# Patient Record
Sex: Female | Born: 1995 | Race: White | Hispanic: No | Marital: Single | Smoking: Never smoker
Health system: Southern US, Community
[De-identification: ages and names within clinical notes are randomized; demographics above are authoritative.]

## PROBLEM LIST (undated history)

## (undated) DIAGNOSIS — Z789 Other specified health status: Secondary | ICD-10-CM

## (undated) DIAGNOSIS — J45909 Unspecified asthma, uncomplicated: Secondary | ICD-10-CM

## (undated) HISTORY — PX: NO PAST SURGERIES: SHX2092

## (undated) HISTORY — DX: Unspecified asthma, uncomplicated: J45.909

---

## 2017-09-11 ENCOUNTER — Inpatient Hospital Stay (HOSPITAL_COMMUNITY): Payer: Managed Care, Other (non HMO)

## 2017-09-11 ENCOUNTER — Encounter (HOSPITAL_COMMUNITY): Payer: Self-pay | Admitting: *Deleted

## 2017-09-11 ENCOUNTER — Ambulatory Visit (HOSPITAL_COMMUNITY)
Admission: EM | Admit: 2017-09-11 | Discharge: 2017-09-11 | Payer: Managed Care, Other (non HMO) | Source: Home / Self Care

## 2017-09-11 ENCOUNTER — Inpatient Hospital Stay (HOSPITAL_COMMUNITY)
Admission: AD | Admit: 2017-09-11 | Discharge: 2017-09-11 | Disposition: A | Discharge status: Home / Self Care | Payer: Managed Care, Other (non HMO) | Source: Ambulatory Visit | Attending: Obstetrics and Gynecology | Admitting: Obstetrics and Gynecology

## 2017-09-11 DIAGNOSIS — Z975 Presence of (intrauterine) contraceptive device: Secondary | ICD-10-CM | POA: Diagnosis not present

## 2017-09-11 DIAGNOSIS — T839XXA Unspecified complication of genitourinary prosthetic device, implant and graft, initial encounter: Secondary | ICD-10-CM

## 2017-09-11 DIAGNOSIS — R102 Pelvic and perineal pain: Secondary | ICD-10-CM | POA: Diagnosis not present

## 2017-09-11 DIAGNOSIS — Z3202 Encounter for pregnancy test, result negative: Secondary | ICD-10-CM | POA: Diagnosis not present

## 2017-09-11 HISTORY — DX: Other specified health status: Z78.9

## 2017-09-11 LAB — URINALYSIS, ROUTINE W REFLEX MICROSCOPIC
Bilirubin Urine: NEGATIVE
GLUCOSE, UA: NEGATIVE mg/dL
KETONES UR: 20 mg/dL — AB
Leukocytes, UA: NEGATIVE
Nitrite: NEGATIVE
PH: 6 (ref 5.0–8.0)
Protein, ur: NEGATIVE mg/dL
Specific Gravity, Urine: 1.008 (ref 1.005–1.030)

## 2017-09-11 LAB — WET PREP, GENITAL
CLUE CELLS WET PREP: NONE SEEN
SPERM: NONE SEEN
TRICH WET PREP: NONE SEEN
YEAST WET PREP: NONE SEEN

## 2017-09-11 LAB — POCT PREGNANCY, URINE: Preg Test, Ur: NEGATIVE

## 2017-09-11 MED ORDER — KETOROLAC TROMETHAMINE 60 MG/2ML IM SOLN
60.0000 mg | Freq: Once | INTRAMUSCULAR | Status: AC
  Administered 2017-09-11: 60 mg via INTRAMUSCULAR
  Filled 2017-09-11: qty 2

## 2017-09-11 MED ORDER — DOXYCYCLINE HYCLATE 100 MG PO TABS: 100.0000 mg | ORAL_TABLET | Freq: Two times a day (BID) | ORAL | 0 refills | Status: AC

## 2017-09-11 NOTE — MAU Note (Signed)
IUD placed on 9/24 States has been having abdominal pain off and on since then; very severe today Rating pain 10/10  +nausea; denies vomiting

## 2017-09-11 NOTE — Discharge Instructions (Signed)
Call your OB-GYN provider and schedule a follow up appointment for alter this week or early next week.  If you develop high fever, or persistent vomiting, return here or to another urgent care or emergency room.

## 2017-09-11 NOTE — MAU Provider Note (Signed)
Chief Complaint:  Abdominal Pain   First Provider Initiated Contact with Patient 09/11/17 1909       HPI: Jaime Williams is a 21 y.o. [id number] who presents to maternity admissions reporting severe pain in pelvis.  Also has some nausea.  Had IUD place on 9/24 and had some cramping which mostly stopped.  Had been doing fine since until today when severe pain started. . She reports vaginal bleeding, vaginal itching/burning, urinary symptoms, h/a, dizziness, n/v, or fever/chills.    Abdominal Pain  This is a new problem. The current episode started today. The onset quality is gradual. The problem occurs intermittently. The problem has been unchanged. The pain is located in the suprapubic region, LLQ and RLQ. The quality of the pain is sharp and cramping. The abdominal pain does not radiate. Associated symptoms include nausea. Pertinent negatives include no constipation, diarrhea, fever, headaches, myalgias or vomiting. Nothing aggravates the pain. The pain is relieved by nothing. She has tried acetaminophen for the symptoms. The treatment provided no relief.    RN note: IUD placed on 9/24 States has been having abdominal pain off and on since then; very severe today Rating pain 10/10 +nausea; denies vomiting  Past Medical History: Past Medical History:  Diagnosis Date  . Medical history non-contributory     Past obstetric history: OB History  Gravida Para Term Preterm AB Living  0 0 0 0 0 0  SAB TAB Ectopic Multiple Live Births  0 0 0 0 0        Past Surgical History: Past Surgical History:  Procedure Laterality Date  . NO PAST SURGERIES      Family History: No family history on file.  Social History: Social History  Substance Use Topics  . Smoking status: Never Smoker  . Smokeless tobacco: Never Used  . Alcohol use No    Allergies: No Known Allergies  Meds:  Prescriptions Prior to Admission  Medication Sig Dispense Refill Last Dose  . acetaminophen (TYLENOL) 500 MG  tablet Take 1,000 mg by mouth every 6 (six) hours as needed for moderate pain.   09/11/2017 at Unknown time  . escitalopram (LEXAPRO) 20 MG tablet Take 20 mg by mouth at bedtime.   09/10/2017 at Unknown time  . IRON PO Take 1 tablet by mouth 3 (three) times daily.   Past Week at Unknown time  . levonorgestrel (MIRENA) 20 MCG/24HR IUD 1 each by Intrauterine route once.   09/11/2017 at Unknown time  . QUEtiapine (SEROQUEL XR) 300 MG 24 hr tablet Take 300 mg by mouth at bedtime.   09/10/2017 at Unknown time    I have reviewed patient's Past Medical Hx, Surgical Hx, Family Hx, Social Hx, medications and allergies.  ROS:  Review of Systems  Constitutional: Negative for fever.  Gastrointestinal: Positive for abdominal pain and nausea. Negative for constipation, diarrhea and vomiting.  Musculoskeletal: Negative for myalgias.  Neurological: Negative for headaches.   Other systems negative     Physical Exam  Patient Vitals for the past 24 hrs:  BP Temp Temp src Pulse Resp SpO2 Weight  09/11/17 1842 134/72 98.1 F (36.7 C) Oral 86 19 96 % 153 lb 0.6 oz (69.4 kg)   Constitutional: Well-developed, well-nourished female in no acute distress.  Cardiovascular: normal rate and rhythm, no ectopy audible, S1 & S2 heard, no murmur Respiratory: normal effort, no distress. Lungs CTAB with no wheezes or crackles GI: Abd soft, non-tender.  Nondistended.  No rebound, No guarding.  Bowel Sounds audible  MS: Extremities nontender, no edema, normal ROM Neurologic: Alert and oriented x 4.   Grossly nonfocal. GU: Neg CVAT. Skin:  Warm and Dry Psych:  Affect appropriate.  PELVIC EXAM: Cervix pink, visually closed, without lesion, scant white creamy discharge, vaginal walls and external genitalia normal Bimanual exam: Cervix firm, anterior, neg CMT, uterus tender with palpation, nonenlarged, adnexa without tenderness, enlargement, or mass    Labs: Results for orders placed or performed during the hospital  encounter of 09/11/17 (from the past 24 hour(s))  Urinalysis, Routine w reflex microscopic     Status: Abnormal   Collection Time: 09/11/17  6:44 PM  Result Value Ref Range   Color, Urine STRAW (A) YELLOW   APPearance CLEAR CLEAR   Specific Gravity, Urine 1.008 1.005 - 1.030   pH 6.0 5.0 - 8.0   Glucose, UA NEGATIVE NEGATIVE mg/dL   Hgb urine dipstick SMALL (A) NEGATIVE   Bilirubin Urine NEGATIVE NEGATIVE   Ketones, ur 20 (A) NEGATIVE mg/dL   Protein, ur NEGATIVE NEGATIVE mg/dL   Nitrite NEGATIVE NEGATIVE   Leukocytes, UA NEGATIVE NEGATIVE   RBC / HPF 0-5 0 - 5 RBC/hpf   WBC, UA 0-5 0 - 5 WBC/hpf   Bacteria, UA MANY (A) NONE SEEN   Squamous Epithelial / LPF 0-5 (A) NONE SEEN   Mucus PRESENT   Pregnancy, urine POC     Status: None   Collection Time: 09/11/17  7:16 PM  Result Value Ref Range   Preg Test, Ur NEGATIVE NEGATIVE      Imaging:  No results found.  MAU Course/MDM: I have ordered labs as follows: UA Imaging ordered: Ultrasound Results reviewed. UA shows some hematuria, but no leukocytes.but micro shows bacteria, ?UTI  Consult Dr Vincente Poli with presentation.  She recommends removal if IUD misplaced. .   Treatments in MAU included Toradol which did not give much relief.     Assessment: 1. Pelvic pain   2. IUD complication [facility])     Plan: Care turned over to oncoming provider Pt is in [place]  Wynelle Bourgeois CNM, MSN Certified Nurse-Midwife 09/11/2017 9:03 PM  10:03 PM -- U/S reviewed: IUD in endometrial cavity in appropriate orientation.  Discussed results with patient, given symptoms, wet prep and GC/Chlamydia swabs collected, will send urine for culture, and will empirically treat with doxycycline 100 mg BID po to cover for both endometritis and possible UTI.  Raynelle Fanning P. Kentaro Alewine, MD OB Fellow

## 2017-09-12 LAB — GC/CHLAMYDIA PROBE AMP (~~LOC~~) NOT AT ARMC
CHLAMYDIA, DNA PROBE: NEGATIVE
NEISSERIA GONORRHEA: NEGATIVE

## 2017-09-13 LAB — URINE CULTURE: Culture: <10000 — AB

## 2018-08-30 ENCOUNTER — Encounter: Payer: Self-pay | Admitting: *Deleted

## 2018-09-13 ENCOUNTER — Encounter: Payer: Self-pay | Admitting: Psychiatry

## 2018-09-13 ENCOUNTER — Ambulatory Visit (INDEPENDENT_AMBULATORY_CARE_PROVIDER_SITE_OTHER): Payer: 59 | Admitting: Psychiatry

## 2018-09-13 VITALS — BP 107/71 | HR 85 | Ht 68.0 in

## 2018-09-13 DIAGNOSIS — F3181 Bipolar II disorder: Secondary | ICD-10-CM

## 2018-09-13 DIAGNOSIS — F419 Anxiety disorder, unspecified: Secondary | ICD-10-CM

## 2018-09-13 MED ORDER — BREXPIPRAZOLE 1 MG PO TABS: 1.0000 mg | ORAL_TABLET | Freq: Every day | ORAL | 1 refills | Status: AC

## 2018-09-13 MED ORDER — LITHIUM CARBONATE 150 MG PO CAPS: ORAL_CAPSULE | ORAL | 1 refills | Status: AC

## 2018-09-13 MED ORDER — ESCITALOPRAM OXALATE 20 MG PO TABS: 20.0000 mg | ORAL_TABLET | Freq: Every day | ORAL | 1 refills | Status: AC

## 2018-09-13 NOTE — Progress Notes (Signed)
Jaime Williams [id number] 08-05-1996 22 y.o.  Subjective:   Patient ID:  Jaime Williams is a 22 y.o. (DOB 01-May-1996) female.  Chief Complaint:  Chief Complaint  Patient presents with  . Follow-up    h/o depression, anxiety, mood lability, and h/o chronic SI  . Depression  . Anxiety       Jaime Williams presents to the office today for follow-up of depression and is accompanied by significant other. She reports that she has not noticed a significant difference with Lithium. Reports that she initially felt "happier" after starting Lithium and denies any recent hypomanic s/s.   Reports that she had sexual side effects with Lithium 300 mg qhs. She then reduced her dose to 150 mg po QHS the last few days.  She reports that her mood has been "good" overall. "Since starting Rexulti I haven't had anything that I would consider to be an episode like I used to have, except for after I drank." Reports that she has had periods of sadness "but nothing persistent." slef harm thoughts x 5, self harm behavior x 3, SI x 3 in the last month. She reports that self harm thoughts "were a different kind" since starting Lithium. Reports that she has been anxious about someone breaking into their apartment with boyfriend is at work. She reports sleeping 10 hours the last few days "because I don't have anything else to do." Appetite has been stable. Energy and motivation "seems normal." Denies impaired concentration. Denies SI.   Reports that she is questioning dx of BPD since she has fears of abandonment and chronic thoughts of self harm and suicide.   Moving to [place] to live with significant other. Plans to finish her coursework there and continue tx there.   Medications: I have reviewed the patient's current medications. Denies side effects  Allergies: No Known Allergies  Past Medical History:  Diagnosis Date  . Asthma    Reports h/o exercise induced asthma  . Medical history non-contributory      Past Surgical History:  Procedure Laterality Date  . NO PAST SURGERIES      Family History  Problem Relation Age of Onset  . Depression Mother   . Depression Maternal Aunt   . Depression Maternal Grandmother     Social History   Socioeconomic History  . Marital status: Single    Spouse name: Not on file  . Number of children: Not on file  . Years of education: Not on file  . Highest education level: Not on file  Occupational History  . Occupation: Consulting civil engineer  Social Needs  . Financial resource strain: Not on file  . Food insecurity:    Worry: Not on file    Inability: Not on file  . Transportation needs:    Medical: Not on file    Non-medical: Not on file  Tobacco Use  . Smoking status: Never Smoker  . Smokeless tobacco: Never Used  Substance and Sexual Activity  . Alcohol use: No  . Drug use: No  . Sexual activity: Yes  Lifestyle  . Physical activity:    Days per week: Not on file    Minutes per session: Not on file  . Stress: Not on file  Relationships  . Social connections:    Talks on phone: Not on file    Gets together: Not on file    Attends religious service: Not on file    Active member of club or organization: Not on file  Attends meetings of clubs or organizations: Not on file    Relationship status: Not on file  . Intimate partner violence:    Fear of current or ex partner: Not on file    Emotionally abused: Not on file    Physically abused: Not on file    Forced sexual activity: Not on file  Other Topics Concern  . Not on file  Social History Narrative  . Not on file    Past Medical History, Surgical history, Social history, and Family history were reviewed and updated as appropriate.   Please see review of systems for further details on the patient's review from today.   Review of Systems:  Review of Systems  Gastrointestinal: Positive for constipation.  Skin: Negative for rash.  Allergic/Immunologic: Negative.     Objective:    Physical Exam:  BP 107/71   Pulse 85   Ht 5\' 8"  (1.727 m)   BMI 23.27 kg/m   Physical Exam  Constitutional: She is oriented to person, place, and time. She appears well-developed. No distress.  Musculoskeletal: She exhibits no deformity.  Neurological: She is alert and oriented to person, place, and time. Coordination normal.  Psychiatric: Her speech is normal and behavior is normal. Judgment and thought content normal. Her mood appears anxious. Her affect is not angry, not blunt, not labile and not inappropriate. She is not agitated, not aggressive and not slowed. Cognition and memory are normal. She does not exhibit a depressed mood. She expresses no homicidal and no suicidal ideation. She expresses no suicidal plans and no homicidal plans.  Pt picking at skin periodically on exam.     Lab Review:  No results found for: NA, K, CL, CO2, GLUCOSE, BUN, CREATININE, CALCIUM, PROT, ALBUMIN, AST, ALT, ALKPHOS, BILITOT, GFRNONAA, GFRAA  No results found for: WBC, RBC, HGB, HCT, PLT, MCV, MCH, MCHC, RDW, LYMPHSABS, MONOABS, EOSABS, BASOSABS  No results found for: POCLITH, LITHIUM     Assessment: Plan:   Patient seen for 30 minutes and greater than 50% of session spent counseling patient regarding her questions about borderline personality disorder to include discussing signs and symptoms of borderline personality disorder and treatment recommendations.  Strongly encourage patient to initiate therapy in mood geographical location and that she may want to search for a therapist that incorporates DBT skills.  Also counseled patient regarding her questions about the differences between bipolar 1 and bipolar 2 diagnoses. Anxiety disorder, unspecified type - Plan: escitalopram (LEXAPRO) 20 MG tablet  Bipolar II disorder (HCC) - Plan: Brexpiprazole (REXULTI) 1 MG TABS, escitalopram (LEXAPRO) 20 MG tablet, lithium carbonate 150 MG capsule  Please see After Visit Summary for patient specific  instructions.  Future Appointments  Date Time Provider Department Center  12/13/2018  1:00 PM Corie Chiquito, PMHNP [facility] None    No orders of the defined types were placed in this encounter.     -------------------------------

## 2018-09-15 DIAGNOSIS — F3181 Bipolar II disorder: Secondary | ICD-10-CM | POA: Insufficient documentation

## 2018-09-16 ENCOUNTER — Ambulatory Visit: Payer: Self-pay | Admitting: Psychiatry

## 2018-12-13 ENCOUNTER — Ambulatory Visit: Payer: 59 | Admitting: Psychiatry

## 2019-04-13 IMAGING — US US PELVIS COMPLETE
1 series · 15 of 25 positions shown · non-contrast
Comparison: None

CLINICAL DATA: Evaluate for IUD.



[Series 1: us pelvis complete · 60 acquisitions, 15 frames shown]
[im 1/60]
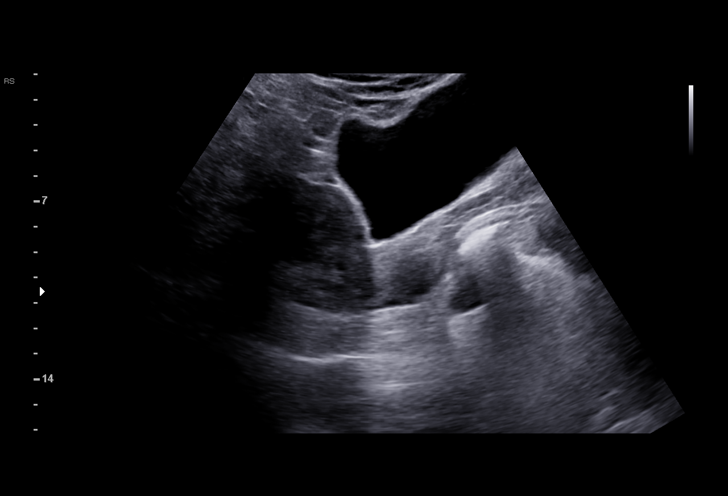
[im 5/60]
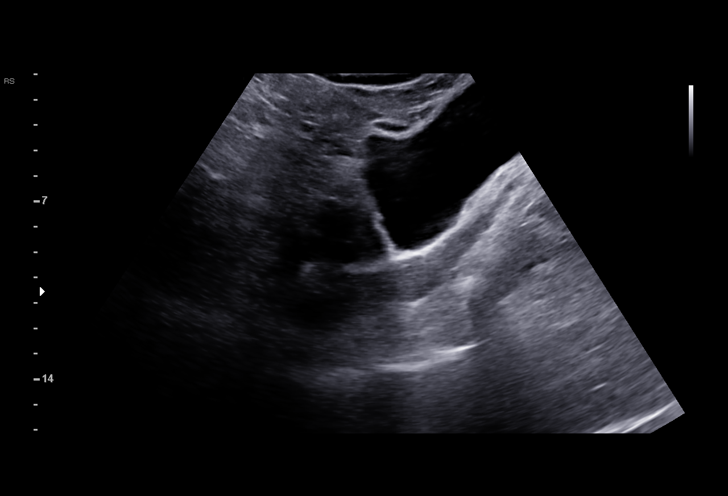
[im 10/60]
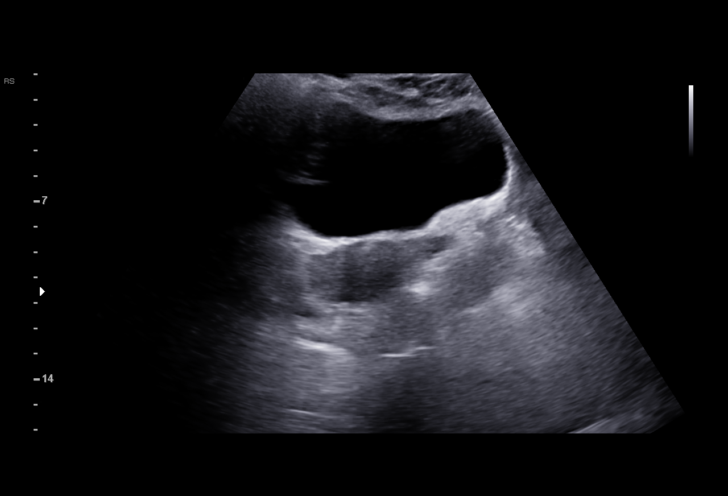
[im 13/60]
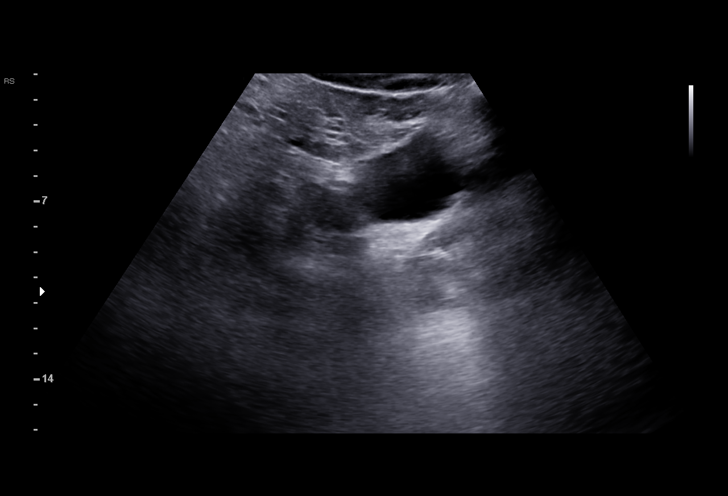
[im 18/60]
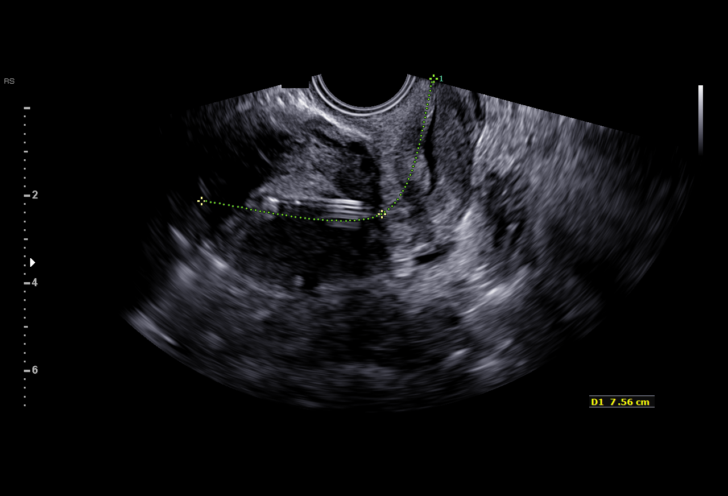
[im 23/60]
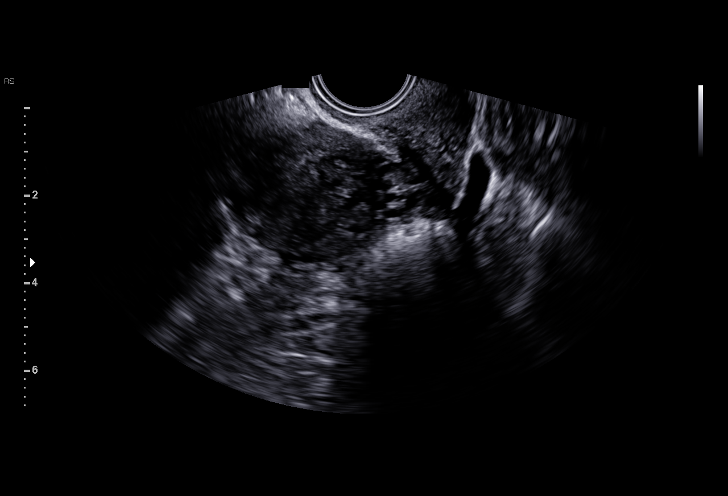
[im 25/60]
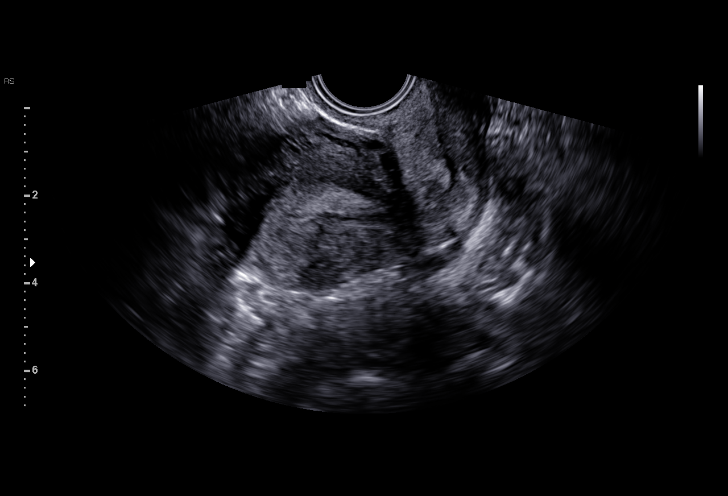
[im 30/60]
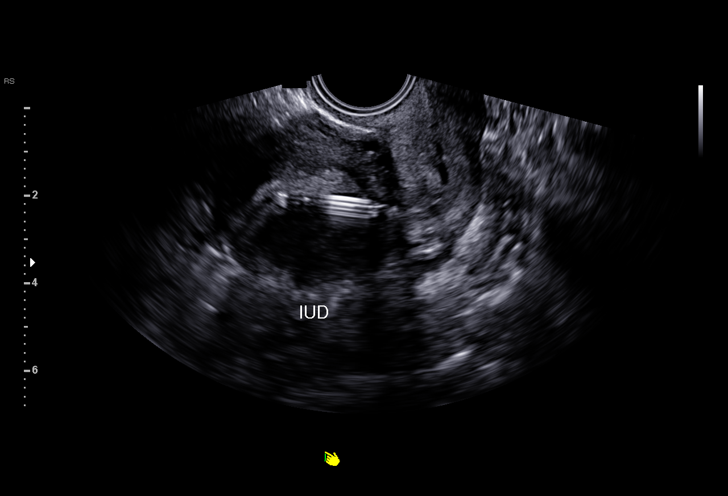
[im 35/60]
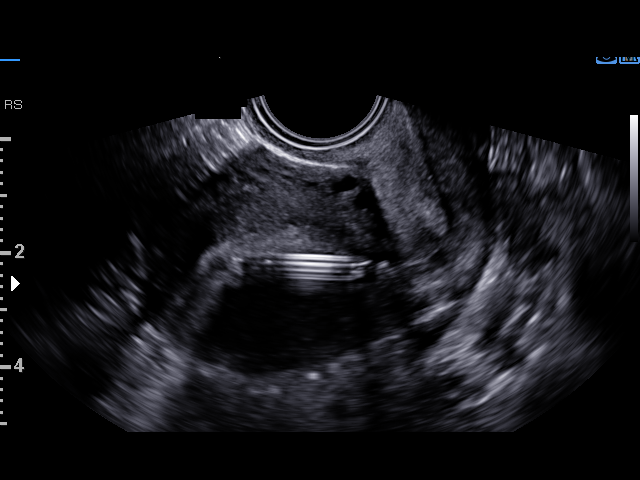
[im 37/60]
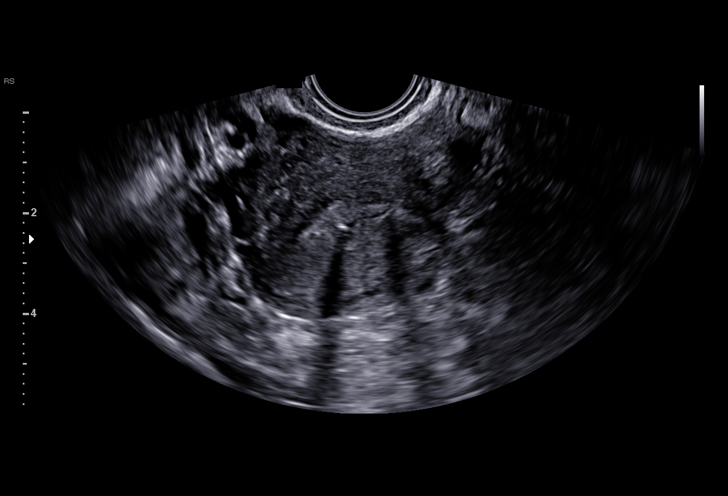
[im 42/60]
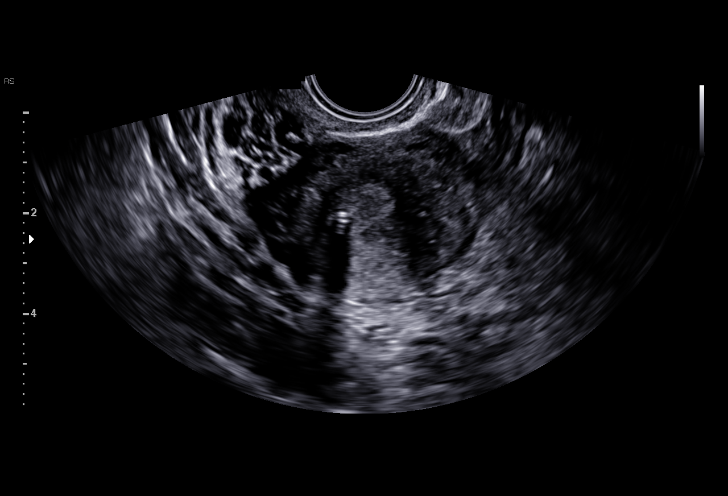
[im 47/60]
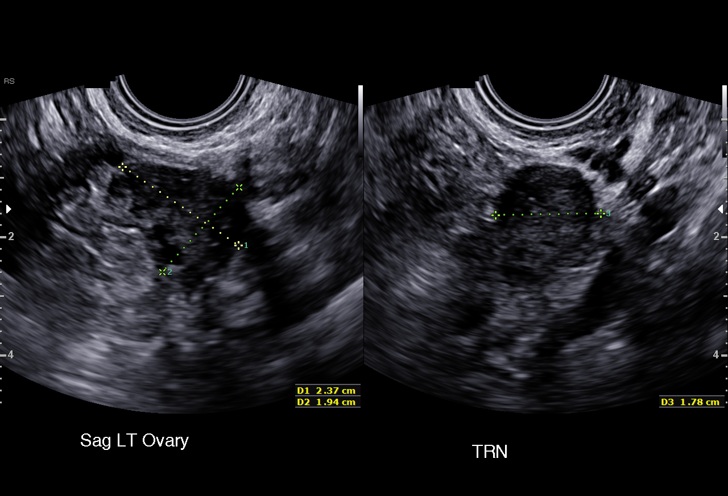
[im 50/60]
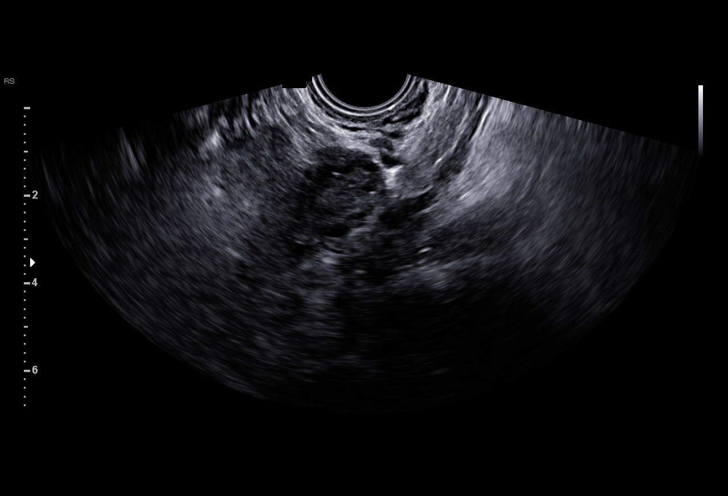
[im 55/60]
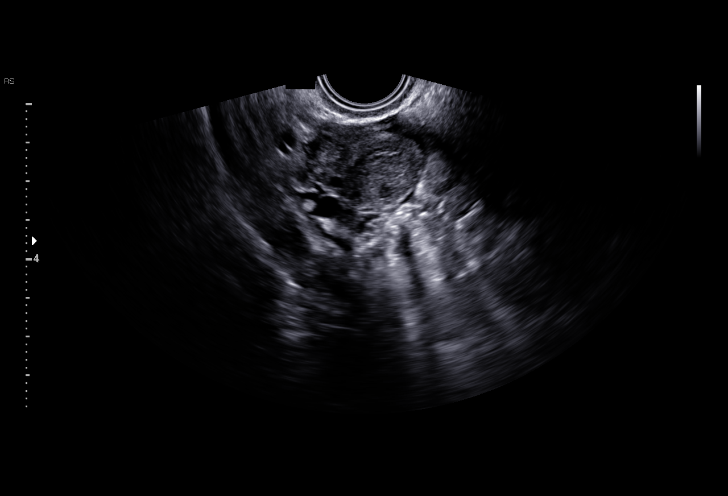
[im 60/60]
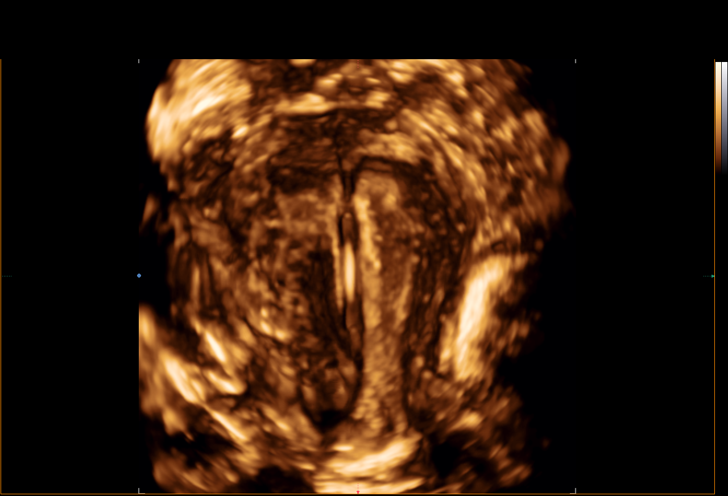

[15 of 25 positions shown; findings below may reference images not displayed]

FINDINGS: Uterus

Measurements: 7.6 x 4.5 x 4.4 cm.. Appears anteflexed. No focal
mass.

Endometrium

Thickness: 9.5 mm. IUD is identified and appears to be in
appropriate orientation within the endometrial cavity. No focal
abnormality visualized.

Right ovary

Measurements: 3.3 x 2.2 x 2.4 cm. Normal appearance/no adnexal mass.

Left ovary

Measurements: 2.4 x 1.9 x 1.8 cm. Normal appearance/no adnexal mass.

Other findings

Trace free fluid noted within the pelvis.
IMPRESSION: 1. IUD visualized within the endometrial cavity.
